# Patient Record
Sex: Female | Born: 1985 | Race: White | Hispanic: No | Marital: Married | Smoking: Current every day smoker
Health system: Southern US, Community
[De-identification: ages and names within clinical notes are randomized; demographics above are authoritative.]

## PROBLEM LIST (undated history)

## (undated) HISTORY — PX: CHOLECYSTECTOMY: SHX55

## (undated) HISTORY — PX: TUBAL LIGATION: SHX77

---

## 2006-03-27 ENCOUNTER — Emergency Department (HOSPITAL_COMMUNITY): Admission: EM | Admit: 2006-03-27 | Discharge: 2006-03-27 | Payer: Self-pay | Admitting: Emergency Medicine

## 2006-06-27 ENCOUNTER — Emergency Department (HOSPITAL_COMMUNITY): Admission: EM | Admit: 2006-06-27 | Discharge: 2006-06-27 | Payer: Self-pay | Admitting: Emergency Medicine

## 2006-11-07 ENCOUNTER — Emergency Department (HOSPITAL_COMMUNITY): Admission: EM | Admit: 2006-11-07 | Discharge: 2006-11-07 | Payer: Self-pay | Admitting: Emergency Medicine

## 2007-03-20 ENCOUNTER — Inpatient Hospital Stay (HOSPITAL_COMMUNITY): Admission: EM | Admit: 2007-03-20 | Discharge: 2007-03-22 | Payer: Self-pay | Admitting: Emergency Medicine

## 2007-03-21 ENCOUNTER — Encounter (INDEPENDENT_AMBULATORY_CARE_PROVIDER_SITE_OTHER): Payer: Self-pay | Admitting: *Deleted

## 2007-09-24 ENCOUNTER — Emergency Department (HOSPITAL_COMMUNITY): Admission: EM | Admit: 2007-09-24 | Discharge: 2007-09-24 | Payer: Self-pay | Admitting: Emergency Medicine

## 2009-03-03 ENCOUNTER — Other Ambulatory Visit: Admission: RE | Admit: 2009-03-03 | Discharge: 2009-03-03 | Payer: Self-pay | Admitting: Obstetrics and Gynecology

## 2009-08-18 ENCOUNTER — Inpatient Hospital Stay (HOSPITAL_COMMUNITY): Admission: AD | Admit: 2009-08-18 | Discharge: 2009-08-18 | Payer: Self-pay | Admitting: Obstetrics & Gynecology

## 2009-09-02 ENCOUNTER — Inpatient Hospital Stay (HOSPITAL_COMMUNITY): Admission: AD | Admit: 2009-09-02 | Discharge: 2009-09-04 | Payer: Self-pay | Admitting: Obstetrics & Gynecology

## 2009-09-02 ENCOUNTER — Ambulatory Visit: Payer: Self-pay | Admitting: Advanced Practice Midwife

## 2009-10-15 ENCOUNTER — Emergency Department (HOSPITAL_COMMUNITY): Admission: EM | Admit: 2009-10-15 | Discharge: 2009-10-15 | Payer: Self-pay | Admitting: Emergency Medicine

## 2010-02-08 ENCOUNTER — Emergency Department (HOSPITAL_COMMUNITY): Admission: EM | Admit: 2010-02-08 | Discharge: 2010-02-08 | Payer: Self-pay | Admitting: Emergency Medicine

## 2010-04-06 ENCOUNTER — Emergency Department (HOSPITAL_COMMUNITY): Admission: EM | Admit: 2010-04-06 | Discharge: 2010-04-07 | Payer: Self-pay | Admitting: Emergency Medicine

## 2010-05-05 ENCOUNTER — Encounter (HOSPITAL_COMMUNITY): Admission: RE | Admit: 2010-05-05 | Discharge: 2010-06-09 | Payer: Self-pay | Admitting: Orthopaedic Surgery

## 2010-06-10 ENCOUNTER — Encounter (HOSPITAL_COMMUNITY): Admission: RE | Admit: 2010-06-10 | Discharge: 2010-07-10 | Payer: Self-pay | Admitting: Orthopaedic Surgery

## 2011-03-17 LAB — URINALYSIS, ROUTINE W REFLEX MICROSCOPIC
Glucose, UA: NEGATIVE mg/dL
Ketones, ur: NEGATIVE mg/dL
Nitrite: NEGATIVE
pH: 5.5 (ref 5.0–8.0)

## 2011-03-17 LAB — DIFFERENTIAL
Basophils Absolute: 0 10*3/uL (ref 0.0–0.1)
Basophils Relative: 0 % (ref 0–1)
Eosinophils Absolute: 0.5 10*3/uL (ref 0.0–0.7)
Lymphocytes Relative: 29 % (ref 12–46)
Lymphs Abs: 2.4 10*3/uL (ref 0.7–4.0)
Monocytes Absolute: 0.4 10*3/uL (ref 0.1–1.0)
Monocytes Relative: 5 % (ref 3–12)

## 2011-03-17 LAB — CBC
Hemoglobin: 12.2 g/dL (ref 12.0–15.0)
MCHC: 35 g/dL (ref 30.0–36.0)
Platelets: 270 10*3/uL (ref 150–400)
RDW: 14.9 % (ref 11.5–15.5)

## 2011-03-17 LAB — URINE MICROSCOPIC-ADD ON

## 2011-03-17 LAB — PREGNANCY, URINE: Preg Test, Ur: NEGATIVE

## 2011-03-19 LAB — CBC
HCT: 31 % — ABNORMAL LOW (ref 36.0–46.0)
MCV: 86 fL (ref 78.0–100.0)
Platelets: 334 10*3/uL (ref 150–400)
RBC: 3.61 MIL/uL — ABNORMAL LOW (ref 3.87–5.11)
RDW: 14 % (ref 11.5–15.5)
WBC: 22.4 10*3/uL — ABNORMAL HIGH (ref 4.0–10.5)

## 2011-04-30 NOTE — Discharge Summary (Signed)
[name], [name]              ACCOUNT NO.:  [id number]   MEDICAL RECORD NO.:  [id number]          PATIENT TYPE:  INP   LOCATION:  A322                          FACILITY:  [facility]   PHYSICIAN:  Barbaraann Barthel, M.D. DATE OF BIRTH:  Oct 28, 1986   DATE OF ADMISSION:  03/20/2007  DATE OF DISCHARGE:  04/09/2008LH                               DISCHARGE SUMMARY   PROCEDURE:  On March 21, 2007 - laparoscopic cholecystectomy.   DIAGNOSIS:  Cholecystitis secondary to cholelithiasis.   HISTORY OF PRESENT ILLNESS:  This is a 25 year old white female who was  admitted through the emergency room for acute cholecystitis.  She had  been seen previously at [facility], was discharged and later  returned to our emergency room.   HOSPITAL COURSE:  She was admitted and antibiotics were begun.  Hydration was initiated and she was taken to surgery on March 21, 2007,  at which time a laparoscopic cholecystectomy was performed.  Final  pathology revealed cholecystitis and cholelithiasis.  Her hospital  course was completely benign.  She was discharged on the first  postoperative day.   LABORATORY DATA:  Her sonogram showed cholecystitis with cholelithiasis  present.   Her metabolic 7 was grossly within normal limits.  Her liver function  studies were also within normal limits.  Her bilirubin was 0.22.  Her  amylase was 63.  Her lipase was 24.   DISCHARGE EXAM:  At that time of discharge her wound was clean.  She was  voiding well without dysuria.  Her white count was 7.4 with an H and H  of 10.6 and 29.4.  Her liver function studies remained within normal  limits.   DISPOSITION:  We made plans to follow her perioperatively after surgery.  She is discharged on a liquid diet and a soft diet.  She is excused from  work.  She is told to do no heavy lifting.  She is permitted to go up  and down the stairs.  She is told to refrain from any vigorous sexual  activity.  She is told to clean her wound  with alcohol three times a  day.  She is given a prescription for Darvocet 1 tablet every 4 hours as  needed for pain.  She is to call us or come to the emergency room if  there are any acute changes.   Please note that this is the second dictation on this patient.      Barbaraann Barthel, M.D.  Electronically Signed    WB/MEDQ  D:  04/12/2007  T:  04/12/2007  Job:  [id number]

## 2011-04-30 NOTE — Consult Note (Signed)
[name], [name]              ACCOUNT NO.:  [id number]   MEDICAL RECORD NO.:  [id number]          PATIENT TYPE:  INP   LOCATION:  A322                          FACILITY:  [facility]   PHYSICIAN:  Barbaraann Barthel, M.D. DATE OF BIRTH:  01-18-1986   DATE OF CONSULTATION:  03/20/2007  DATE OF DISCHARGE:                                 CONSULTATION   Surgery was asked to see this 25 year old white female for cholecystitis  secondary to cholelithiasis.   CHIEF COMPLAINT:  Right upper quadrant pain with nausea and vomiting.  This has been going on for 2 weeks.   HISTORY OF PRESENT MEDICAL ILLNESS:  As the patient states that she had  this pain for at least 2 weeks.  She was seen in the emergency room at  [facility] where she was discharged, told that the stones would  pass spontaneously.  She continued to have discomfort and came to the  emergency room. Surgery was consulted and responded immediately.   PHYSICAL EXAMINATION:  VITAL SIGNS:  Examination discloses a pleasant 53-  year-old white female who is approximately 5 feet 1 inch and weighs 120  pounds.  Her vital signs are as follows.  She is afebrile with a  temperature of 98.3, blood pressure 134/89, pulse rate 80-81,  respirations 20.  O2 saturation was 98%.  HEENT:  Head is normocephalic.  Eyes:  Extraocular movements intact.  Pupils were round, react to light and accommodation.  There is no  conjunctival pallor or injected sclera.  Nose and oral mucosa are moist.  The patient has her tongue pierced.  NECK:  Supple and cylindrical without jugular vein distension,  thyromegaly or tracheal deviation.  No bruits are auscultated, and there  is no adenopathy.  HEART:  Regular rhythm.  LUNGS:  Clear.  BREASTS AND AXILLA:  Without masses.  ABDOMEN:  The patient has some tenderness in the right upper quadrant on  deep palpation.  Bowel sounds are normoactive.  There are no femoral  inguinal hernias appreciated.  The patient  has a pierced umbilicus.  RECTAL:  No tenderness on palpating the cervix through the rectum. The  stool is guaiac negative.  EXTREMITIES:  Within normal limits without clubbing, varicosities or  cyanosis.   REVIEW OF SYSTEMS:  GI SYSTEM: Nausea and vomiting, right upper quadrant  pain for least 2 weeks and prior to this time as well.  The patient has  known remembered history of hepatitis.  No bright red rectal bleeding,  black tarry stools, change in her bowel habits or unexplained weight  loss.   GU SYSTEM:  She has had some urinary tract infections in the past.  She  has no dysuria at present.  No history of nephrolithiasis.   ENDOCRINE SYSTEM:  No history of diabetes or thyroid disease.   MUSCULOSKELETAL SYSTEM:  Within normal limits.   OB/GYN HISTORY:  The patient is a gravida 3, para 3, abortus zero,  cesarean zero female who has no known history of carcinoma of the breast  in her family.  Her last menstrual period was a 2 days ago, and  her  urine HCG is negative here in the emergency room.   LABORATORY DATA:  Sonogram shows cholecystitis, cholelithiasis present  on sonography.   Her laboratory work shows that she has a white count of 8.2, hemoglobin  13, and hematocrit 37.4 with 64 neutrophils noted.  Metabolic-7 is  grossly within normal limits.  Liver function studies are within normal  limits.  Bilirubin 0.22. Amylase is 63.  Lipase is 24.   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  She has had no  other surgery.   She has no known allergies and takes no medications on a regular basis,  although her mother gave her some Vicodin and for the pain that she was  having related her recent gallbladder problems.   IMPRESSION:  Therefore, cholecystitis secondary to cholelithiasis.   PLAN:  1. Admit.  2. Hydration.  3. Diet restriction.  4. Initiate antibiotics.  5. We will plan for a  surgery during this hospitalization.      Barbaraann Barthel, M.D.  Electronically Signed     WB/MEDQ  D:  03/20/2007  T:  03/20/2007  Job:  [id number]   cc:   Rhae Lerner. Margretta Ditty, M.D.  [address]  [place]  [place] [address]

## 2011-04-30 NOTE — Op Note (Signed)
[name], [name]              ACCOUNT NO.:  [id number]   MEDICAL RECORD NO.:  [id number]          PATIENT TYPE:  INP   LOCATION:  A322                          FACILITY:  [facility]   PHYSICIAN:  Barbaraann Barthel, M.D. DATE OF BIRTH:  09/12/1986   DATE OF PROCEDURE:  03/21/2007  DATE OF DISCHARGE:                               OPERATIVE REPORT   SURGEON:  Barbaraann Barthel, M.D.   PREOPERATIVE DIAGNOSIS:  Cholecystitis and cholelithiasis.   POSTOPERATIVE DIAGNOSIS:  Cholecystitis and cholelithiasis.   SPECIMEN:  Gallbladder with stones.   PROCEDURE:  Laparoscopic cholecystectomy.   NOTE:  This is a 25 year old white female who came to the emergency room  with recurrent episodes of right upper quadrant pain, nausea and  vomiting.  Was found on sonography to have cholecystitis secondary to  cholelithiasis.  We discussed surgery with her preoperatively,  discussing complications not limited to but including bleeding,  infection, damage to bile ducts, perforation of organs and transitory  diarrhea.  Informed consent was obtained.   GROSS OPERATIVE FINDINGS:  Gallbladder with minimal amount of adhesions,  small cystic duct which was not cannulated and a sort of mulberry  appearing stone within the gallbladder.  The rest of the right upper  quadrant otherwise appeared to be normal.  The liver appeared to be have  somewhat fatty infiltration.   TECHNIQUE:  The patient was placed in the supine position.  After the  adequate administration of general anesthesia via endotracheal  intubation, her entire abdomen was prepped with Betadine solution and  draped in the usual manner.  Her Foley catheter was aseptically  inserted.  With the patient in Trendelenburg, a periumbilical incision  was carried out over the superior aspect of the umbilicus.  A sharp  towel clip was used to elevate the fascia.  A Veress needle was then  inserted and confirmed in position with a saline drop test.  The  abdomen  was then insufflated with approximately 3.5 liters of CO2.  We then  grasped the gallbladder after inserting an 11 mm cannula in the  epigastrium and two 5 mm cannulas in the right upper quadrant laterally  under direct vision from the camera in the umbilicus.  The gallbladder  was then grasped.  Its adhesions were taken down.  The cystic duct was  clearly visualized, triply silver clipped on the side of the common bile  duct and singly silver clipped on the side of the gallbladder and  divided.  The cystic artery was likewise clamped and divided.  The  gallbladder was then removed using the hook cautery device uneventfully  from the liver bed.  This was then removed using the EndoCatch device.  A stone came out of the gallbladder just at the time of removal of the  gallbladder and this was retrieved and placed in the Endo sac and  removed with the specimen.  After irrigating with normal saline solution  and checking for hemostasis, the abdomen was then desufflated and the  port incisions were then closed using 0 Polysorb in the area of the  umbilicus and the epigastrium.  We used 0.5% Sensorcaine approximately  20 cc to help with postoperative  comfort in all the port sites.  Skin was closed with the stapling  device.  Prior to closure all sponge, needle and instrument counts were  found to be correct.  Estimated blood loss was minimal.  The patient  received 1100 cc of crystalloid intraoperatively.  No drains were  placed.  There were no complications.      Barbaraann Barthel, M.D.  Electronically Signed     WB/MEDQ  D:  03/21/2007  T:  03/21/2007  Job:  [id number]   cc:   Rhae Lerner. Margretta Ditty, M.D.  [address]  [place]  [place] [address]

## 2011-08-29 IMAGING — CR DG CERVICAL SPINE COMPLETE 4+V
5 series · 5 of 5 positions shown · non-contrast
Comparison: None

CLINICAL DATA: Right neck pain with radiation the right upper
extremity

CERVICAL SPINE - COMPLETE 4+ VIEW

[view not recorded (1 of 5)]
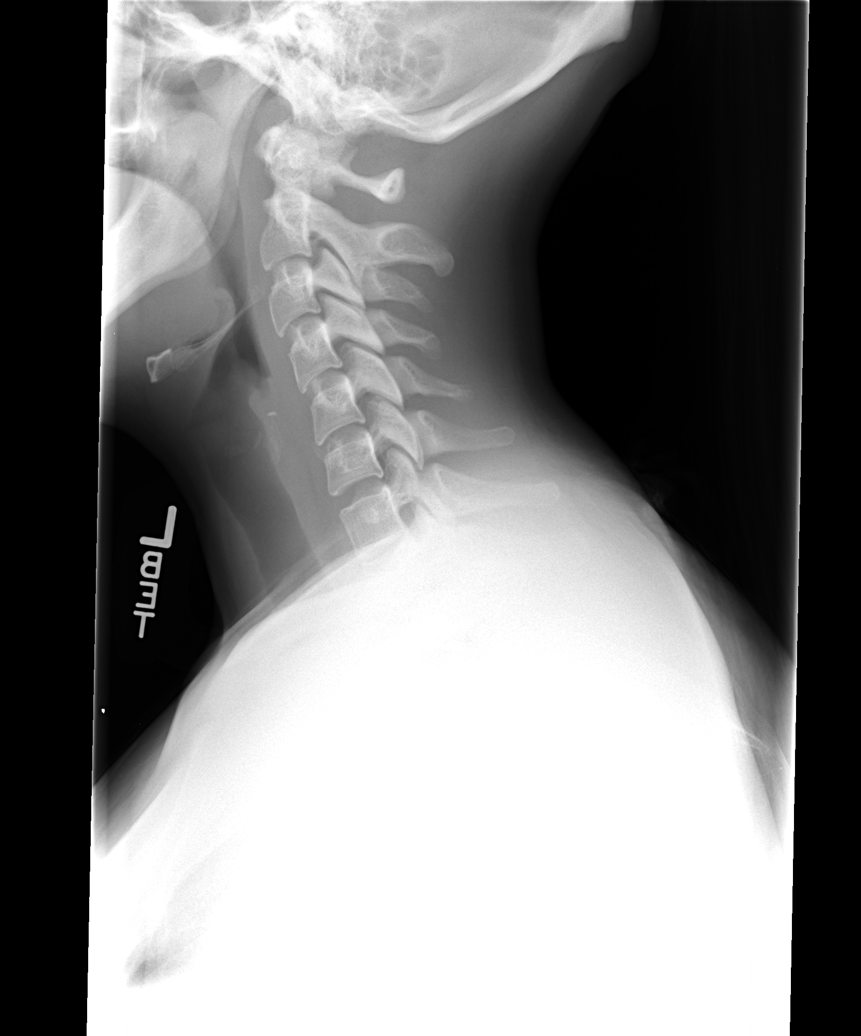

[view not recorded (2 of 5)]
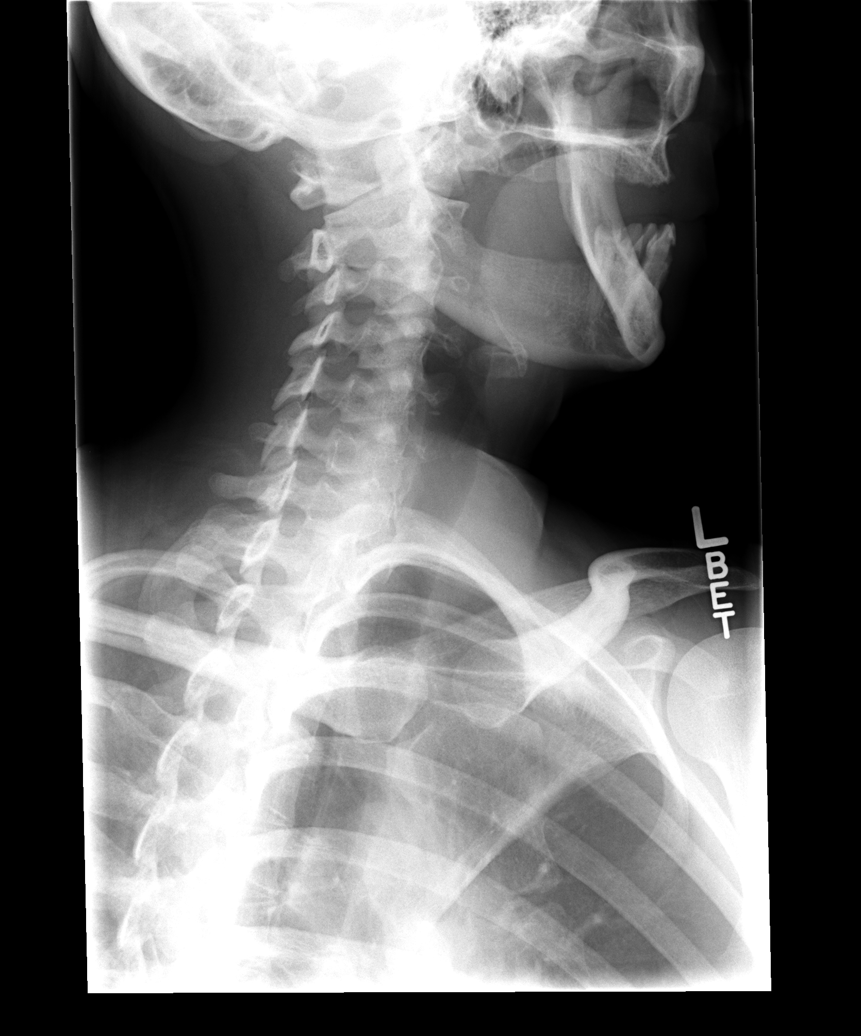

[view not recorded (3 of 5)]
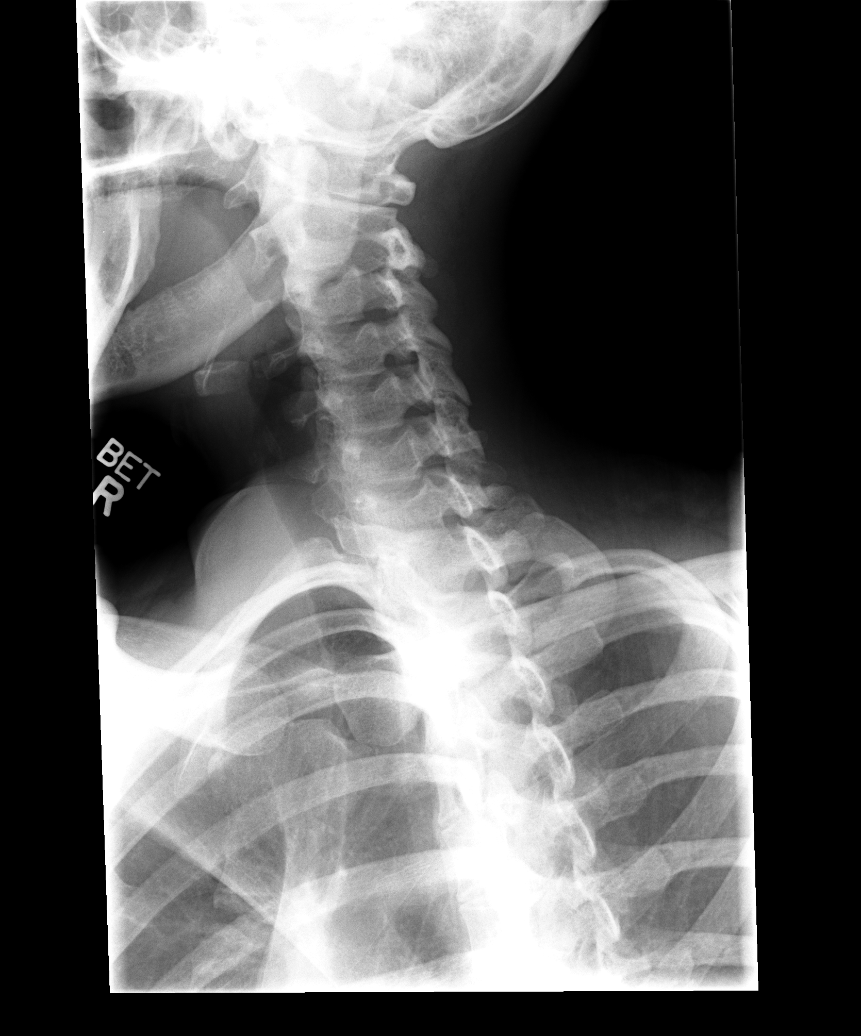

[view not recorded (4 of 5)]
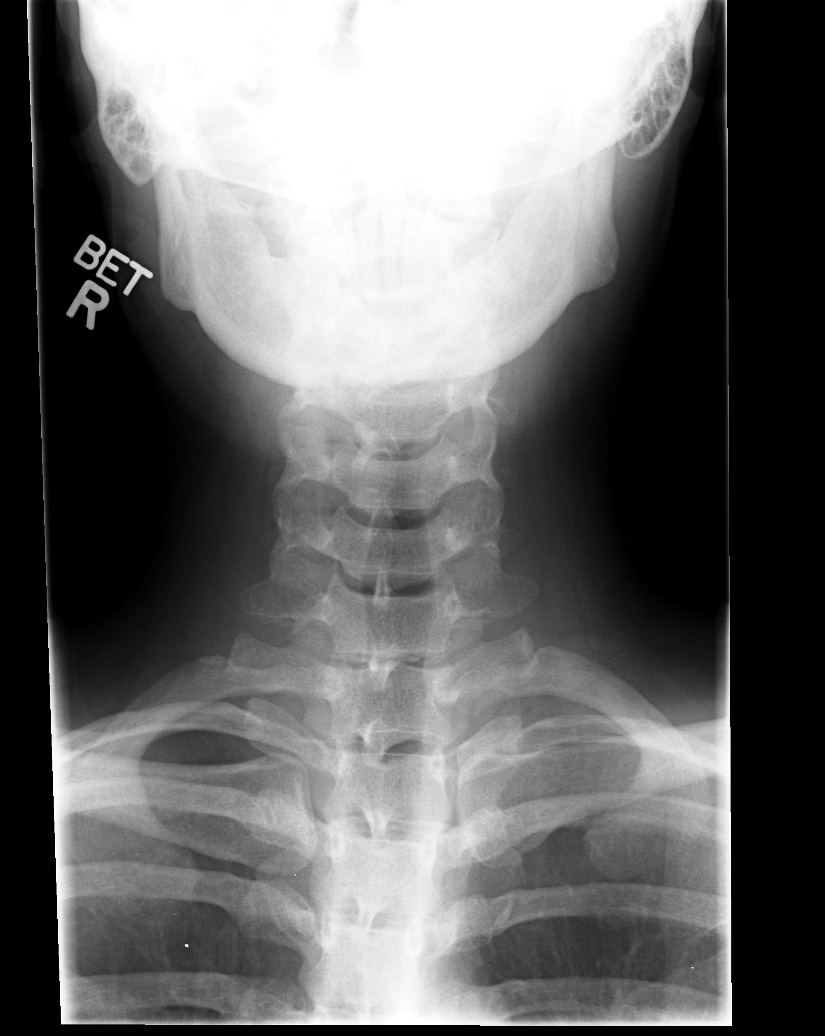

[view not recorded (5 of 5)]
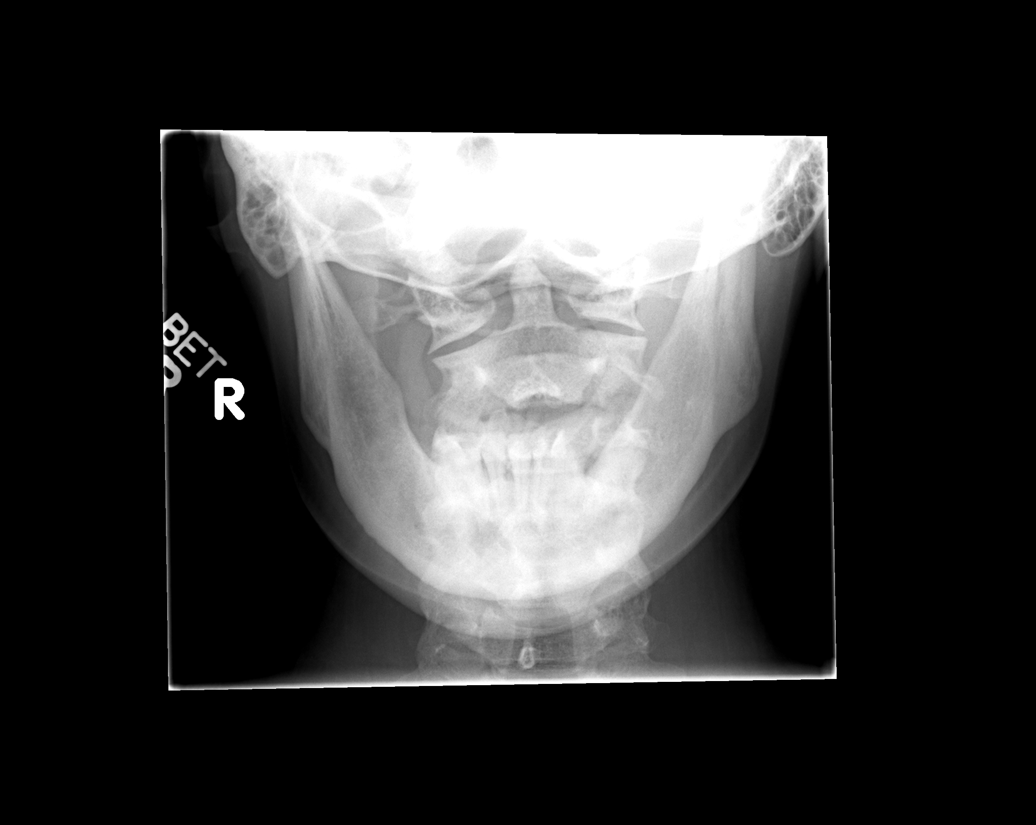

[5 of 5 positions shown; findings below may reference images not displayed]

FINDINGS: Cervical vertebrae normal in height and alignment.
Prevertebral soft tissues normal thickness.
Bone mineralization normal.
Bony foramina patent.
No acute fracture, subluxation, or bone destruction.
C1-C2 alignment normal.
IMPRESSION: No acute bony abnormalities.
If the patient has radicular symptoms, consider follow-up non
emergent MRI imaging of the cervical spine.

## 2016-02-04 ENCOUNTER — Telehealth: Payer: Self-pay | Admitting: Orthopaedic Surgery

## 2016-02-04 NOTE — Telephone Encounter (Signed)
Patient requesting refill for Hydrocodone 7.5/325mg   Qty 120 tablets

## 2016-02-05 ENCOUNTER — Ambulatory Visit (INDEPENDENT_AMBULATORY_CARE_PROVIDER_SITE_OTHER): Payer: Medicaid Other

## 2016-02-05 ENCOUNTER — Ambulatory Visit (INDEPENDENT_AMBULATORY_CARE_PROVIDER_SITE_OTHER): Payer: Medicaid Other | Admitting: Orthopaedic Surgery

## 2016-02-05 ENCOUNTER — Encounter: Payer: Self-pay | Admitting: Orthopaedic Surgery

## 2016-02-05 VITALS — BP 141/85 | HR 98 | Temp 98.1°F | Resp 16 | Ht 62.0 in | Wt 110.0 lb

## 2016-02-05 DIAGNOSIS — M542 Cervicalgia: Secondary | ICD-10-CM

## 2016-02-05 MED ORDER — HYDROCODONE-ACETAMINOPHEN 7.5-325 MG PO TABS: 1.0000 | ORAL_TABLET | ORAL | Status: DC | PRN

## 2016-02-05 NOTE — Telephone Encounter (Signed)
Left message that prescription is available for pick up.

## 2016-02-05 NOTE — Progress Notes (Addendum)
Patient [name], female DOB:11/03/1986, 30 y.o. [id number]:[id number]  Chief Complaint  Patient presents with  . Follow-up    follow up neck pain, "not to bad today"    HPI  [name] is a 30 y.o. female who has a long history of chronic neck pain with some right neck and upper trapezius pain.  She has no trauma.  She has no paresthesias.   Her pain is deep and throbbing at times.  It is not getting better and getting worse over the last few weeks.  She has tried ice and heat and Advil as well as her pain medicine.  The pain medicine works the best.  She does some exercises as well. HPI  Body mass index is 20.11 kg/(m^2).   Review of Systems  Constitutional:       Patient does not have Diabetes Mellitus. Patient does not have hypertension. Patient does not have COPD or shortness of breath. Patient does not have BMI > 35. Patient has current smoking history.  HENT: Negative for congestion.   Respiratory: Negative for cough and shortness of breath.   Cardiovascular: Positive for chest pain.  Endocrine: Positive for cold intolerance.  Musculoskeletal: Positive for myalgias, arthralgias and neck pain.  Allergic/Immunologic: Positive for environmental allergies.  Psychiatric/Behavioral: The patient is nervous/anxious.     No past medical history on file.  No past surgical history on file.  No family history on file.  Social History Social History  Substance Use Topics  . Smoking status: Current Every Day Smoker  . Smokeless tobacco: None  . Alcohol Use: None    No Known Allergies  Current Outpatient Prescriptions  Medication Sig Dispense Refill  . HYDROcodone-acetaminophen (NORCO) 7.5-325 MG tablet Take 1 tablet by mouth every 4 (four) hours as needed for moderate pain (Must last 30 days.  Do not drive or operate machinery while taking this medicine.). 120 tablet 0   No current facility-administered medications for this visit.     Physical Exam  Blood  pressure 141/85, pulse 98, temperature 98.1 F (36.7 C), resp. rate 16, height  (1.575 m), weight 110 lb (49.896 kg), last menstrual period 01/19/2016.  Constitutional: overall normal hygiene, normal nutrition, well developed, normal grooming, normal body habitus. Assistive device:none  Musculoskeletal: gait and station Limp none, muscle tone and strength are normal, no tremors or atrophy is present.  .  Neurological: coordination overall normal.  Deep tendon reflex/nerve stretch intact.  Sensation normal.  Cranial nerves II-XII intact.   Skin:   normal overall no scars, lesions, ulcers or rashes. No psoriasis.  Psychiatric: Alert and oriented x 3.  Recent memory intact, remote memory unclear.  Normal mood and affect. Well groomed.  Good eye contact.  Cardiovascular: overall no swelling, no varicosities, no edema bilaterally, normal temperatures of the legs and arms, no clubbing, cyanosis and good capillary refill.  Lymphatic: palpation is normal.  She has pain of the right side of the neck and upper trapezius with no spasm.    Extremities:upper extremities are normal Inspection normal Strength and tone normal upper extremities Range of motion full of neck and of both shoulders but the neck has more pain to the right.  Additional services performed: x-rays of the neck. I have talked to her about her smoking.  She is not willing to quit. She needs to consider this.  I went over exercises with her to do.  The patient has been educated about the nature of the problem(s) and counseled  on treatment options.  The patient appeared to understand what I have discussed and is in agreement with it. Encounter Diagnosis  Name Primary?  . Neck pain Yes    PLAN Call if any problems.  Precautions discussed.  Continue current medications.   Return to clinic 2 months

## 2016-02-05 NOTE — Telephone Encounter (Signed)
Rx printed

## 2016-02-05 NOTE — Patient Instructions (Signed)
Smoking Cessation, Tips for Success If you are ready to quit smoking, congratulations! You have chosen to help yourself be healthier. Cigarettes bring nicotine, tar, carbon monoxide, and other irritants into your body. Your lungs, heart, and blood vessels will be able to work better without these poisons. There are many different ways to quit smoking. Nicotine gum, nicotine patches, a nicotine inhaler, or nicotine nasal spray can help with physical craving. Hypnosis, support groups, and medicines help break the habit of smoking. WHAT THINGS CAN I DO TO MAKE QUITTING EASIER?  Here are some tips to help you quit for good:  Pick a date when you will quit smoking completely. Tell all of your friends and family about your plan to quit on that date.  Do not try to slowly cut down on the number of cigarettes you are smoking. Pick a quit date and quit smoking completely starting on that day.  Throw away all cigarettes.   Clean and remove all ashtrays from your home, work, and car.  On a card, write down your reasons for quitting. Carry the card with you and read it when you get the urge to smoke.  Cleanse your body of nicotine. Drink enough water and fluids to keep your urine clear or pale yellow. Do this after quitting to flush the nicotine from your body.  Learn to predict your moods. Do not let a bad situation be your excuse to have a cigarette. Some situations in your life might tempt you into wanting a cigarette.  Never have "just one" cigarette. It leads to wanting another and another. Remind yourself of your decision to quit.  Change habits associated with smoking. If you smoked while driving or when feeling stressed, try other activities to replace smoking. Stand up when drinking your coffee. Brush your teeth after eating. Sit in a different chair when you read the paper. Avoid alcohol while trying to quit, and try to drink fewer caffeinated beverages. Alcohol and caffeine may urge you to  smoke.  Avoid foods and drinks that can trigger a desire to smoke, such as sugary or spicy foods and alcohol.  Ask people who smoke not to smoke around you.  Have something planned to do right after eating or having a cup of coffee. For example, plan to take a walk or exercise.  Try a relaxation exercise to calm you down and decrease your stress. Remember, you may be tense and nervous for the first 2 weeks after you quit, but this will pass.  Find new activities to keep your hands busy. Play with a pen, coin, or rubber band. Doodle or draw things on paper.  Brush your teeth right after eating. This will help cut down on the craving for the taste of tobacco after meals. You can also try mouthwash.   Use oral substitutes in place of cigarettes. Try using lemon drops, carrots, cinnamon sticks, or chewing gum. Keep them handy so they are available when you have the urge to smoke.  When you have the urge to smoke, try deep breathing.  Designate your home as a nonsmoking area.  If you are a heavy smoker, ask your health care provider about a prescription for nicotine chewing gum. It can ease your withdrawal from nicotine.  Reward yourself. Set aside the cigarette money you save and buy yourself something nice.  Look for support from others. Join a support group or smoking cessation program. Ask someone at home or at work to help you with your plan   to quit smoking.  Always ask yourself, "Do I need this cigarette or is this just a reflex?" Tell yourself, "Today, I choose not to smoke," or "I do not want to smoke." You are reminding yourself of your decision to quit.  Do not replace cigarette smoking with electronic cigarettes (commonly called e-cigarettes). The safety of e-cigarettes is unknown, and some may contain harmful chemicals.  If you relapse, do not give up! Plan ahead and think about what you will do the next time you get the urge to smoke. HOW WILL I FEEL WHEN I QUIT SMOKING? You  may have symptoms of withdrawal because your body is used to nicotine (the addictive substance in cigarettes). You may crave cigarettes, be irritable, feel very hungry, cough often, get headaches, or have difficulty concentrating. The withdrawal symptoms are only temporary. They are strongest when you first quit but will go away within 10-14 days. When withdrawal symptoms occur, stay in control. Think about your reasons for quitting. Remind yourself that these are signs that your body is healing and getting used to being without cigarettes. Remember that withdrawal symptoms are easier to treat than the major diseases that smoking can cause.  Even after the withdrawal is over, expect periodic urges to smoke. However, these cravings are generally short lived and will go away whether you smoke or not. Do not smoke! WHAT RESOURCES ARE AVAILABLE TO HELP ME QUIT SMOKING? Your health care provider can direct you to community resources or hospitals for support, which may include:  Group support.  Education.  Hypnosis.  Therapy.   This information is not intended to replace advice given to you by your health care provider. Make sure you discuss any questions you have with your health care provider.   Document Released: 08/27/2004 Document Revised: 12/20/2014 Document Reviewed: 05/17/2013 Elsevier Interactive Patient Education 2016 [employer] Inc[employer]  

## 2016-03-02 ENCOUNTER — Telehealth: Payer: Self-pay | Admitting: Orthopaedic Surgery

## 2016-03-02 MED ORDER — HYDROCODONE-ACETAMINOPHEN 5-325 MG PO TABS: 1.0000 | ORAL_TABLET | ORAL | Status: DC | PRN

## 2016-03-02 NOTE — Telephone Encounter (Signed)
Patient requesting refill of Hydrocodone  

## 2016-03-02 NOTE — Telephone Encounter (Signed)
Rx printed

## 2016-04-06 ENCOUNTER — Ambulatory Visit (INDEPENDENT_AMBULATORY_CARE_PROVIDER_SITE_OTHER): Payer: Medicaid Other | Admitting: Orthopaedic Surgery

## 2016-04-06 ENCOUNTER — Encounter: Payer: Self-pay | Admitting: Orthopaedic Surgery

## 2016-04-06 VITALS — BP 139/92 | HR 96 | Ht 62.0 in | Wt 110.0 lb

## 2016-04-06 DIAGNOSIS — M542 Cervicalgia: Secondary | ICD-10-CM | POA: Diagnosis not present

## 2016-04-06 MED ORDER — HYDROCODONE-ACETAMINOPHEN 5-325 MG PO TABS: 1.0000 | ORAL_TABLET | ORAL | Status: DC | PRN

## 2016-04-06 NOTE — Progress Notes (Signed)
Patient WG:NFAOZHY:[name], female DOB:May 08, 1986, 30 y.o. [id number]  Chief Complaint  Patient presents with  . Follow-up    neck pain     HPI  [name] is a 30 y.o. female who has chronic neck pain.  She is stable.  She has no paresthesias.  She has no new trauma.  She has been doing her exercises. She is taking her exercises and using ice at times.   HPI  Body mass index is 20.11 kg/(m^2).  Review of Systems  Constitutional:       Patient does not have Diabetes Mellitus. Patient does not have hypertension. Patient does not have COPD or shortness of breath. Patient does not have BMI > 35. Patient has current smoking history.  HENT: Negative for congestion.   Respiratory: Negative for cough and shortness of breath.   Cardiovascular: Positive for chest pain.  Endocrine: Positive for cold intolerance.  Musculoskeletal: Positive for myalgias, arthralgias and neck pain.  Allergic/Immunologic: Positive for environmental allergies.  Psychiatric/Behavioral: The patient is nervous/anxious.     History reviewed. No pertinent past medical history.  History reviewed. No pertinent past surgical history.  History reviewed. No pertinent family history.  Social History Social History  Substance Use Topics  . Smoking status: Current Every Day Smoker  . Smokeless tobacco: None  . Alcohol Use: None    No Known Allergies  Current Outpatient Prescriptions  Medication Sig Dispense Refill  . acetaminophen (TYLENOL) 325 MG tablet Take 650 mg by mouth every 6 (six) hours as needed.    Marland Kitchen. HYDROcodone-acetaminophen (NORCO/VICODIN) 5-325 MG tablet Take 1 tablet by mouth every 4 (four) hours as needed for moderate pain (Must last 30 days.  Do not take and drive a car or use machinery.). 120 tablet 0   No current facility-administered medications for this visit.     Physical Exam  Blood pressure 139/92, pulse 96, height 5\' 2"  (1.575 m), weight 110 lb (49.896  kg).  Constitutional: overall normal hygiene, normal nutrition, well developed, normal grooming, normal body habitus. Assistive device:none  Musculoskeletal: gait and station Limp none, muscle tone and strength are normal, no tremors or atrophy is present.  .  Neurological: coordination overall normal.  Deep tendon reflex/nerve stretch intact.  Sensation normal.  Cranial nerves II-XII intact.   Skin:   normal overall no scars, lesions, ulcers or rashes. No psoriasis.  Psychiatric: Alert and oriented x 3.  Recent memory intact, remote memory unclear.  Normal mood and affect. Well groomed.  Good eye contact.  Cardiovascular: overall no swelling, no varicosities, no edema bilaterally, normal temperatures of the legs and arms, no clubbing, cyanosis and good capillary refill.  Lymphatic: palpation is normal. Back Exam   Tenderness  The patient is experiencing tenderness in the cervical.  Range of Motion  The patient has normal back ROM.  Muscle Strength  The patient has normal back strength.  Tests  Straight leg raise right: negative Straight leg raise left: negative  Reflexes  Patellar: normal Achilles: normal Biceps: normal Babinski's sign: normal   Other  Toe Walk: normal Heel Walk: normal Sensation: normal Gait: normal  Erythema: no back redness Scars: absent      The patient has been educated about the nature of the problem(s) and counseled on treatment options.  The patient appeared to understand what I have discussed and is in agreement with it.  Encounter Diagnosis  Name Primary?  . Neck pain Yes    PLAN Call if any problems.  Precautions discussed.  Continue current medications.   Return to clinic 2 months

## 2016-05-05 ENCOUNTER — Telehealth: Payer: Self-pay | Admitting: Orthopaedic Surgery

## 2016-05-06 MED ORDER — HYDROCODONE-ACETAMINOPHEN 5-325 MG PO TABS: 1.0000 | ORAL_TABLET | ORAL | Status: DC | PRN

## 2016-05-06 NOTE — Telephone Encounter (Signed)
Rx done. 

## 2016-06-04 ENCOUNTER — Telehealth: Payer: Self-pay | Admitting: Orthopaedic Surgery

## 2016-06-07 MED ORDER — HYDROCODONE-ACETAMINOPHEN 5-325 MG PO TABS: 1.0000 | ORAL_TABLET | ORAL | Status: DC | PRN

## 2016-06-07 NOTE — Addendum Note (Signed)
Addended by: Shyloh Derosa, JOHN W on: 06/07/2016 02:20 PM   Modules accepted: Orders  

## 2016-06-07 NOTE — Telephone Encounter (Signed)
Rx done. 

## 2016-06-08 ENCOUNTER — Encounter: Payer: Self-pay | Admitting: Orthopaedic Surgery

## 2016-06-08 ENCOUNTER — Ambulatory Visit (INDEPENDENT_AMBULATORY_CARE_PROVIDER_SITE_OTHER): Payer: Medicaid Other | Admitting: Orthopaedic Surgery

## 2016-06-08 VITALS — BP 144/88 | HR 81 | Temp 97.5°F | Ht 61.0 in | Wt 113.0 lb

## 2016-06-08 DIAGNOSIS — M542 Cervicalgia: Secondary | ICD-10-CM

## 2016-06-08 NOTE — Progress Notes (Signed)
Patient UJ:[id number]:Dawn Sherron AlesD Busch, female DOB:05-12-1986, 30 y.o. WGN:[id number]  Chief Complaint  Patient presents with  . Follow-up    neck pain     HPI  [name] is a 30 y.o. female who has chronic neck pain.  She has no paresthesias.  She has no new trauma.  She has tightness in the neck at times.  She has been doing her exercises and taking her medicine.  HPI  Body mass index is 21.36 kg/(m^2).  ROS  Review of Systems  Constitutional:       Patient does not have Diabetes Mellitus. Patient does not have hypertension. Patient does not have COPD or shortness of breath. Patient does not have BMI > 35. Patient has current smoking history.  HENT: Negative for congestion.   Respiratory: Negative for cough and shortness of breath.   Cardiovascular: Positive for chest pain.  Endocrine: Positive for cold intolerance.  Musculoskeletal: Positive for myalgias, arthralgias and neck pain.  Allergic/Immunologic: Positive for environmental allergies.  Psychiatric/Behavioral: The patient is nervous/anxious.     History reviewed. No pertinent past medical history.  Past Surgical History  Procedure Laterality Date  . Tubal ligation    . Cholecystectomy      History reviewed. No pertinent family history.  Social History Social History  Substance Use Topics  . Smoking status: Current Every Day Smoker -- 0.75 packs/day for 13 years    Types: Cigarettes  . Smokeless tobacco: Never Used  . Alcohol Use: No    No Known Allergies  Current Outpatient Prescriptions  Medication Sig Dispense Refill  . acetaminophen (TYLENOL) 325 MG tablet Take 650 mg by mouth every 6 (six) hours as needed.    Marland Kitchen. HYDROcodone-acetaminophen (NORCO/VICODIN) 5-325 MG tablet Take 1 tablet by mouth every 4 (four) hours as needed for moderate pain (Must last 30 days.  Do not take and drive a car or use machinery.). 120 tablet 0   No current facility-administered medications for this visit.     Physical  Exam  Blood pressure 144/88, pulse 81, temperature 97.5 F (36.4 C), height 5\' 1"  (1.549 m), weight 113 lb (51.256 kg).  Constitutional: overall normal hygiene, normal nutrition, well developed, normal grooming, normal body habitus. Assistive device:none  Musculoskeletal: gait and station Limp none, muscle tone and strength are normal, no tremors or atrophy is present.  .  Neurological: coordination overall normal.  Deep tendon reflex/nerve stretch intact.  Sensation normal.  Cranial nerves II-XII intact.   Skin:   normal overall no scars, lesions, ulcers or rashes. No psoriasis.  Psychiatric: Alert and oriented x 3.  Recent memory intact, remote memory unclear.  Normal mood and affect. Well groomed.  Good eye contact.  Cardiovascular: overall no swelling, no varicosities, no edema bilaterally, normal temperatures of the legs and arms, no clubbing, cyanosis and good capillary refill.  Lymphatic: palpation is normal.  Neck has full motion but tender more to the right.  She has some tightness of the right nuchal muscles and right upper trapezius.  ROM of both shoulders full and NV intact.  The patient has been educated about the nature of the problem(s) and counseled on treatment options.  The patient appeared to understand what I have discussed and is in agreement with it.  Encounter Diagnosis  Name Primary?  . Neck pain Yes    PLAN Call if any problems.  Precautions discussed.  Continue current medications.   Return to clinic 3 months   Electronically Signed Darreld McleanWayne Roselynne Lortz, MD 6/27/201710:25  AM

## 2016-07-04 ENCOUNTER — Other Ambulatory Visit: Payer: Self-pay | Admitting: Orthopaedic Surgery

## 2016-07-05 ENCOUNTER — Telehealth: Payer: Self-pay | Admitting: Orthopaedic Surgery

## 2016-07-06 MED ORDER — HYDROCODONE-ACETAMINOPHEN 5-325 MG PO TABS: 1.0000 | ORAL_TABLET | ORAL | 0 refills | Status: DC | PRN

## 2016-08-04 ENCOUNTER — Telehealth: Payer: Self-pay | Admitting: Orthopaedic Surgery

## 2016-08-04 MED ORDER — HYDROCODONE-ACETAMINOPHEN 5-325 MG PO TABS: 1.0000 | ORAL_TABLET | ORAL | 0 refills | Status: DC | PRN

## 2016-09-01 ENCOUNTER — Telehealth: Payer: Self-pay | Admitting: Orthopaedic Surgery

## 2016-09-02 MED ORDER — HYDROCODONE-ACETAMINOPHEN 5-325 MG PO TABS: 1.0000 | ORAL_TABLET | Freq: Four times a day (QID) | ORAL | 0 refills | Status: DC | PRN

## 2016-09-08 ENCOUNTER — Ambulatory Visit (INDEPENDENT_AMBULATORY_CARE_PROVIDER_SITE_OTHER): Payer: Medicaid Other | Admitting: Orthopaedic Surgery

## 2016-09-08 ENCOUNTER — Encounter: Payer: Self-pay | Admitting: Orthopaedic Surgery

## 2016-09-08 VITALS — BP 102/78 | HR 83 | Temp 97.9°F | Ht 62.0 in | Wt 108.6 lb

## 2016-09-08 DIAGNOSIS — M542 Cervicalgia: Secondary | ICD-10-CM

## 2016-09-08 DIAGNOSIS — F17210 Nicotine dependence, cigarettes, uncomplicated: Secondary | ICD-10-CM | POA: Diagnosis not present

## 2016-09-08 MED ORDER — HYDROCODONE-ACETAMINOPHEN 5-325 MG PO TABS: 1.0000 | ORAL_TABLET | Freq: Four times a day (QID) | ORAL | 0 refills | Status: DC | PRN

## 2016-09-08 NOTE — Patient Instructions (Signed)
Smoking Cessation, Tips for Success If you are ready to quit smoking, congratulations! You have chosen to help yourself be healthier. Cigarettes bring nicotine, tar, carbon monoxide, and other irritants into your body. Your lungs, heart, and blood vessels will be able to work better without these poisons. There are many different ways to quit smoking. Nicotine gum, nicotine patches, a nicotine inhaler, or nicotine nasal spray can help with physical craving. Hypnosis, support groups, and medicines help break the habit of smoking. WHAT THINGS CAN I DO TO MAKE QUITTING EASIER?  Here are some tips to help you quit for good:  Pick a date when you will quit smoking completely. Tell all of your friends and family about your plan to quit on that date.  Do not try to slowly cut down on the number of cigarettes you are smoking. Pick a quit date and quit smoking completely starting on that day.  Throw away all cigarettes.   Clean and remove all ashtrays from your home, work, and car.  On a card, write down your reasons for quitting. Carry the card with you and read it when you get the urge to smoke.  Cleanse your body of nicotine. Drink enough water and fluids to keep your urine clear or pale yellow. Do this after quitting to flush the nicotine from your body.  Learn to predict your moods. Do not let a bad situation be your excuse to have a cigarette. Some situations in your life might tempt you into wanting a cigarette.  Never have "just one" cigarette. It leads to wanting another and another. Remind yourself of your decision to quit.  Change habits associated with smoking. If you smoked while driving or when feeling stressed, try other activities to replace smoking. Stand up when drinking your coffee. Brush your teeth after eating. Sit in a different chair when you read the paper. Avoid alcohol while trying to quit, and try to drink fewer caffeinated beverages. Alcohol and caffeine may urge you to  smoke.  Avoid foods and drinks that can trigger a desire to smoke, such as sugary or spicy foods and alcohol.  Ask people who smoke not to smoke around you.  Have something planned to do right after eating or having a cup of coffee. For example, plan to take a walk or exercise.  Try a relaxation exercise to calm you down and decrease your stress. Remember, you may be tense and nervous for the first 2 weeks after you quit, but this will pass.  Find new activities to keep your hands busy. Play with a pen, coin, or rubber band. Doodle or draw things on paper.  Brush your teeth right after eating. This will help cut down on the craving for the taste of tobacco after meals. You can also try mouthwash.   Use oral substitutes in place of cigarettes. Try using lemon drops, carrots, cinnamon sticks, or chewing gum. Keep them handy so they are available when you have the urge to smoke.  When you have the urge to smoke, try deep breathing.  Designate your home as a nonsmoking area.  If you are a heavy smoker, ask your health care provider about a prescription for nicotine chewing gum. It can ease your withdrawal from nicotine.  Reward yourself. Set aside the cigarette money you save and buy yourself something nice.  Look for support from others. Join a support group or smoking cessation program. Ask someone at home or at work to help you with your plan   to quit smoking.  Always ask yourself, "Do I need this cigarette or is this just a reflex?" Tell yourself, "Today, I choose not to smoke," or "I do not want to smoke." You are reminding yourself of your decision to quit.  Do not replace cigarette smoking with electronic cigarettes (commonly called e-cigarettes). The safety of e-cigarettes is unknown, and some may contain harmful chemicals.  If you relapse, do not give up! Plan ahead and think about what you will do the next time you get the urge to smoke. HOW WILL I FEEL WHEN I QUIT SMOKING? You  may have symptoms of withdrawal because your body is used to nicotine (the addictive substance in cigarettes). You may crave cigarettes, be irritable, feel very hungry, cough often, get headaches, or have difficulty concentrating. The withdrawal symptoms are only temporary. They are strongest when you first quit but will go away within 10-14 days. When withdrawal symptoms occur, stay in control. Think about your reasons for quitting. Remind yourself that these are signs that your body is healing and getting used to being without cigarettes. Remember that withdrawal symptoms are easier to treat than the major diseases that smoking can cause.  Even after the withdrawal is over, expect periodic urges to smoke. However, these cravings are generally short lived and will go away whether you smoke or not. Do not smoke! WHAT RESOURCES ARE AVAILABLE TO HELP ME QUIT SMOKING? Your health care provider can direct you to community resources or hospitals for support, which may include:  Group support.  Education.  Hypnosis.  Therapy.   This information is not intended to replace advice given to you by your health care provider. Make sure you discuss any questions you have with your health care provider.   Document Released: 08/27/2004 Document Revised: 12/20/2014 Document Reviewed: 05/17/2013 Elsevier Interactive Patient Education 2016 [employer] Inc[employer]  

## 2016-09-08 NOTE — Progress Notes (Signed)
Patient XB:[id number]:[name], female DOB:[birth year], 30 y.o. AOZ:[id number]  Chief Complaint  Patient presents with  . Follow-up    neck pain    HPI  [name] is a 30 y.o. female who has chronic neck pain.  She has no new trauma, no paresthesias. She is doing her exercises. HPI  Body mass index is 19.86 kg/m.  ROS  Review of Systems  Constitutional:       Patient does not have Diabetes Mellitus. Patient does not have hypertension. Patient does not have COPD or shortness of breath. Patient does not have BMI > 35. Patient has current smoking history.  HENT: Negative for congestion.   Respiratory: Negative for cough and shortness of breath.   Cardiovascular: Positive for chest pain.  Endocrine: Positive for cold intolerance.  Musculoskeletal: Positive for arthralgias, myalgias and neck pain.  Allergic/Immunologic: Positive for environmental allergies.  Psychiatric/Behavioral: The patient is nervous/anxious.     No past medical history on file.  Past Surgical History:  Procedure Laterality Date  . CHOLECYSTECTOMY    . TUBAL LIGATION      No family history on file.  Social History Social History  Substance Use Topics  . Smoking status: Current Every Day Smoker    Packs/day: 0.75    Years: 13.00    Types: Cigarettes  . Smokeless tobacco: Never Used  . Alcohol use No    No Known Allergies  Current Outpatient Prescriptions  Medication Sig Dispense Refill  . acetaminophen (TYLENOL) 325 MG tablet Take 650 mg by mouth every 6 (six) hours as needed.    Marland Kitchen. HYDROcodone-acetaminophen (NORCO/VICODIN) 5-325 MG tablet Take 1 tablet by mouth every 6 (six) hours as needed for moderate pain (Must last 14 days.Do not take and drive a car or use machinery.). 56 tablet 0   No current facility-administered medications for this visit.      Physical Exam  Blood pressure 102/78, pulse 83, temperature 97.9 F (36.6 C), height 5\' 2"  (1.575 m), weight 108 lb 9.6 oz (49.3  kg).  Constitutional: overall normal hygiene, normal nutrition, well developed, normal grooming, normal body habitus. Assistive device:none  Musculoskeletal: gait and station Limp none, muscle tone and strength are normal, no tremors or atrophy is present.  .  Neurological: coordination overall normal.  Deep tendon reflex/nerve stretch intact.  Sensation normal.  Cranial nerves II-XII intact.   Skin:   Normal overall no scars, lesions, ulcers or rashes. No psoriasis.  Psychiatric: Alert and oriented x 3.  Recent memory intact, remote memory unclear.  Normal mood and affect. Well groomed.  Good eye contact.  Cardiovascular: overall no swelling, no varicosities, no edema bilaterally, normal temperatures of the legs and arms, no clubbing, cyanosis and good capillary refill.  Lymphatic: palpation is normal.  Her neck has full motion, NV intact, no spasm.  She continues to smoke.  She is willing to stop and is using patches now.    The patient has been educated about the nature of the problem(s) and counseled on treatment options.  The patient appeared to understand what I have discussed and is in agreement with it.  Encounter Diagnoses  Name Primary?  . Neck pain Yes  . Cigarette nicotine dependence without complication     PLAN Call if any problems.  Precautions discussed.  Continue current medications.   Return to clinic 3 months   Electronically Signed Darreld McleanWayne Veatrice Eckstein, MD 9/27/201710:45 AM

## 2016-09-21 ENCOUNTER — Telehealth: Payer: Self-pay | Admitting: Orthopaedic Surgery

## 2016-09-22 MED ORDER — HYDROCODONE-ACETAMINOPHEN 5-325 MG PO TABS: 1.0000 | ORAL_TABLET | Freq: Four times a day (QID) | ORAL | 0 refills | Status: DC | PRN

## 2016-10-12 ENCOUNTER — Telehealth: Payer: Self-pay | Admitting: Orthopaedic Surgery

## 2016-10-12 MED ORDER — HYDROCODONE-ACETAMINOPHEN 5-325 MG PO TABS: 1.0000 | ORAL_TABLET | Freq: Four times a day (QID) | ORAL | 0 refills | Status: DC | PRN

## 2016-10-12 NOTE — Telephone Encounter (Signed)
Patient had a problem with trying to get on Mychart.  She asks that you allow her to call in prescription this time until she gets the issues with Mychart resolved.  Hydrocodone/Acetaminophen 5-325 mgs Qty  45   Sig: Take 1 tablet by mouth every 6 (six) hours as needed for moderate pain (Must last 14 days.Do not take and drive a car or use machinery.).

## 2016-10-18 MED ORDER — HYDROCODONE-ACETAMINOPHEN 5-325 MG PO TABS: 1.0000 | ORAL_TABLET | Freq: Four times a day (QID) | ORAL | 0 refills | Status: DC | PRN

## 2016-11-01 ENCOUNTER — Telehealth: Payer: Self-pay | Admitting: Orthopaedic Surgery

## 2016-11-01 MED ORDER — HYDROCODONE-ACETAMINOPHEN 5-325 MG PO TABS: 1.0000 | ORAL_TABLET | Freq: Four times a day (QID) | ORAL | 0 refills | Status: DC | PRN

## 2016-11-01 NOTE — Telephone Encounter (Signed)
Patient requests a refill on Hydrocodone/Acetaminophen (Norco)  5-325  Mgs.   Qty  30   Sig: Take 1 tablet by mouth every 6 (six) hours as needed for moderate pain (Must last 14 days.Do not take and drive a car or use machinery.).

## 2016-11-18 ENCOUNTER — Telehealth: Payer: Self-pay | Admitting: Orthopaedic Surgery

## 2016-11-22 MED ORDER — HYDROCODONE-ACETAMINOPHEN 5-325 MG PO TABS: 1.0000 | ORAL_TABLET | Freq: Four times a day (QID) | ORAL | 0 refills | Status: AC | PRN

## 2016-11-30 ENCOUNTER — Telehealth: Payer: Self-pay | Admitting: Orthopaedic Surgery

## 2016-11-30 NOTE — Telephone Encounter (Signed)
Hydrocodone-Acetaminophen 5/325 mg   Qty 20 Tablets 

## 2016-11-30 NOTE — Telephone Encounter (Signed)
Deny.  No more.

## 2016-12-02 ENCOUNTER — Ambulatory Visit: Payer: Medicaid Other | Admitting: Orthopaedic Surgery

## 2016-12-08 ENCOUNTER — Ambulatory Visit: Payer: Medicaid Other | Admitting: Orthopaedic Surgery

## 2017-04-22 ENCOUNTER — Encounter (HOSPITAL_COMMUNITY): Payer: Self-pay | Admitting: Emergency Medicine

## 2017-04-22 ENCOUNTER — Emergency Department (HOSPITAL_COMMUNITY)
Admission: EM | Admit: 2017-04-22 | Discharge: 2017-04-22 | Disposition: A | Discharge status: Home / Self Care | Payer: Medicaid Other | Attending: Emergency Medicine | Admitting: Emergency Medicine

## 2017-04-22 ENCOUNTER — Emergency Department (HOSPITAL_COMMUNITY): Payer: Medicaid Other

## 2017-04-22 DIAGNOSIS — F17210 Nicotine dependence, cigarettes, uncomplicated: Secondary | ICD-10-CM | POA: Insufficient documentation

## 2017-04-22 DIAGNOSIS — R509 Fever, unspecified: Secondary | ICD-10-CM | POA: Diagnosis present

## 2017-04-22 DIAGNOSIS — J069 Acute upper respiratory infection, unspecified: Secondary | ICD-10-CM | POA: Insufficient documentation

## 2017-04-22 DIAGNOSIS — J020 Streptococcal pharyngitis: Secondary | ICD-10-CM

## 2017-04-22 LAB — RAPID STREP SCREEN (MED CTR MEBANE ONLY): Streptococcus, Group A Screen (Direct): POSITIVE — AB

## 2017-04-22 MED ORDER — ALBUTEROL SULFATE (2.5 MG/3ML) 0.083% IN NEBU
2.5000 mg | INHALATION_SOLUTION | Freq: Once | RESPIRATORY_TRACT | Status: AC
  Administered 2017-04-22: 2.5 mg via RESPIRATORY_TRACT
  Filled 2017-04-22: qty 3

## 2017-04-22 MED ORDER — AMOXICILLIN 500 MG PO CAPS: 500.0000 mg | ORAL_CAPSULE | Freq: Three times a day (TID) | ORAL | 0 refills | Status: AC

## 2017-04-22 MED ORDER — ALBUTEROL SULFATE HFA 108 (90 BASE) MCG/ACT IN AERS: 2.0000 | INHALATION_SPRAY | RESPIRATORY_TRACT | 0 refills | Status: AC | PRN

## 2017-04-22 NOTE — ED Triage Notes (Signed)
Patient complaining of cough and fever x 1 week. Denies any tylenol or ibuprofen use today.

## 2017-04-22 NOTE — ED Provider Notes (Signed)
[facility]-EMERGENCY DEPT Provider Note   CSN: [id number] Arrival date & time: 04/22/17  0956     History   Chief Complaint Chief Complaint  Patient presents with  . Cough  . Fever    HPI [name] is a 31 y.o. female who presents with 1 week of productive cough and intermittent fever. Patient states that cough has been productive with yellow/green sputum. She has been taking Tylenol for fever relief and reports improvement in fever with use. She has not taken any Tylenol or ibuprofen today. She reports that yesterday she started feeling worse and started having some shortness of breath secondary to the cough which concerned her. She has tried DayQuil for symptom relief with no improvement. She reports associated sore throat, congestion, and chest "ache" when she coughs. No chest pain at rest. She had one episode of vomiting this week and has otherwise been able to tolerate by mouth. She denies any sick contacts. She reports that she smokes about three fourths of a pack cigarettes a day and has done this for the last 20 years. He denies any other illicit drug use.    The history is provided by the patient.    History reviewed. No pertinent past medical history.  There are no active problems to display for this patient.   Past Surgical History:  Procedure Laterality Date  . CHOLECYSTECTOMY    . TUBAL LIGATION      OB History    No data available       Home Medications    Prior to Admission medications   Medication Sig Start Date End Date Taking? Authorizing Provider  acetaminophen (TYLENOL) 325 MG tablet Take 650 mg by mouth every 6 (six) hours as needed.    [provider]  albuterol (PROVENTIL HFA;VENTOLIN HFA) 108 (90 Base) MCG/ACT inhaler Inhale 2 puffs into the lungs every 4 (four) hours as needed for wheezing or shortness of breath. 04/22/17   Maxwell Caul, PA-C  amoxicillin (AMOXIL) 500 MG capsule Take 1 capsule (500 mg total) by mouth 3 (three) times  daily. 04/22/17   Maxwell Caul, PA-C  HYDROcodone-acetaminophen (NORCO/VICODIN) 5-325 MG tablet Take 1 tablet by mouth every 6 (six) hours as needed for moderate pain (Must last 14 days.Do not take and drive a car or use machinery.). 11/22/16   Darreld Mclean, MD    Family History History reviewed. No pertinent family history.  Social History Social History  Substance Use Topics  . Smoking status: Current Every Day Smoker    Packs/day: 0.75    Years: 13.00    Types: Cigarettes  . Smokeless tobacco: Never Used  . Alcohol use No     Allergies   Patient has no known allergies.   Review of Systems Review of Systems  Constitutional: Negative for fever.  HENT: Positive for congestion and sore throat.   Respiratory: Positive for cough and shortness of breath.   Cardiovascular: Negative for chest pain.  Gastrointestinal: Positive for vomiting. Negative for abdominal pain, constipation, diarrhea and nausea.  Genitourinary: Negative for dysuria and hematuria.  Neurological: Negative for headaches.  All other systems reviewed and are negative.    Physical Exam Updated Vital Signs BP 105/69 (BP Location: Right Arm)   Pulse 80   Temp 98 F (36.7 C) (Oral)   Resp 17   Ht 5\' 1"  (1.549 m)   Wt 54.4 kg   LMP 04/12/2017   SpO2 97%   BMI 22.67 kg/m   Physical  Exam  Constitutional: She appears well-developed and well-nourished.  Sitting comfortably on examination table.  HENT:  Head: Normocephalic and atraumatic.  Mouth/Throat: Uvula is midline. Mucous membranes are dry. Oropharyngeal exudate, posterior oropharyngeal edema and posterior oropharyngeal erythema present.  Posterior oropharynx with evidence of exudate, erythema, edema. Uvula is midline. No signs of abscess.   Eyes: Conjunctivae, EOM and lids are normal. Pupils are equal, round, and reactive to light. Right eye exhibits no discharge. Left eye exhibits no discharge. No scleral icterus.  Cardiovascular: Regular  rhythm and intact distal pulses.  Tachycardia present.   Pulmonary/Chest: Effort normal and breath sounds normal. No respiratory distress. She has no decreased breath sounds. She has no wheezes. She has no rales.  No signs of respiratory distress. Able to speak in full sentences without any difficulty. Intermittent coughing. Lung sounds clear to auscultation. No evidence of wheezing, rales or rhonchi.  Musculoskeletal: She exhibits no deformity.  Neurological: She is alert.  Skin: Skin is warm and dry.  Psychiatric: She has a normal mood and affect. Her speech is normal and behavior is normal.     ED Treatments / Results  Labs (all labs ordered are listed, but only abnormal results are displayed) Labs Reviewed  RAPID STREP SCREEN (NOT AT [facility]) - Abnormal; Notable for the following:       Result Value   Streptococcus, Group A Screen (Direct) POSITIVE (*)    All other components within normal limits    EKG  EKG Interpretation None       Radiology Dg Chest 2 View  Result Date: 04/22/2017 CLINICAL DATA:  Cough and fever EXAM: CHEST  2 VIEW COMPARISON:  None. FINDINGS: The heart size and mediastinal contours are within normal limits. Both lungs are clear. The visualized skeletal structures are unremarkable. IMPRESSION: No active cardiopulmonary disease. Electronically Signed   By: Kennith Center M.D.   On: 04/22/2017 10:45    Procedures Procedures (including critical care time)  Medications Ordered in ED Medications  albuterol (PROVENTIL) (2.5 MG/3ML) 0.083% nebulizer solution 2.5 mg (2.5 mg Nebulization Given 04/22/17 1139)     Initial Impression / Assessment and Plan / ED Course  I have reviewed the triage vital signs and the nursing notes.  Pertinent labs & imaging results that were available during my care of the patient were reviewed by me and considered in my medical decision making (see chart for details).   31 year old female with 1 week of coughing and intermittent  fever. No improvement with over-the-counter medications. Patient does have a smoking history. Physical exam shows no signs of respiratory distress. Lung sounds clear to auscultation. No wheezing, rales, rhonchi. Posterior oropharynx with some erythema and exudates. No cervical lymphadenopathy. Consider upper respiratory infection versus bronchitis versus pneumonia, the low suspicion of pneumonia given history/physical exam. Also consider strep given posterior oropharynx findings. Chest x-ray ordered in triage. Will get rapid strep to evaluate for strep pharyngitis. Albuterol nebulizer ordered.  Chest x-ray reviewed. Negative for any acute infectious process. Rapid strep is positive.  Discussed results with patient. She reports improvement of respiratory symptoms after an albuterol nebulizer.  Likely strep pharyngitis with co- existing upper respiratory infection. Will plan to treat with amoxicillin. Will also provide albuterol inhaler to help with respiratory symptoms. Checked patient to follow-up with her primary care doctor in the next 24-48 hours. Strict return precautions discussed. Patient was understanding and agreement to plan.  Final Clinical Impressions(s) / ED Diagnoses   Final diagnoses:  Strep pharyngitis  Upper  respiratory tract infection, unspecified type    New Prescriptions Discharge Medication List as of 04/22/2017 12:27 PM    START taking these medications   Details  albuterol (PROVENTIL HFA;VENTOLIN HFA) 108 (90 Base) MCG/ACT inhaler Inhale 2 puffs into the lungs every 4 (four) hours as needed for wheezing or shortness of breath., Starting Fri 04/22/2017, Print    amoxicillin (AMOXIL) 500 MG capsule Take 1 capsule (500 mg total) by mouth 3 (three) times daily., Starting Fri 04/22/2017, Print         Maxwell CaulLayden, Carlen Rebuck A, PA-C 04/22/17 1620    Lavera GuiseLiu, Dana Duo, MD 04/23/17 [phone]

## 2017-04-22 NOTE — Discharge Instructions (Addendum)
Take medications as prescribed. Finish her course of antibiotics.  Use the albuterol inhaler as directed to help with breathing difficulties.  Use over-the-counter cough syrups such as Delsym to help with the cough relief  Follow-up with her doctor in the next 24-48 hours.  Make sure you're getting a lot of fluids to stay adequately hydrated.  Return to the emergency department for any worsening cough, fever, worsening sore throat, difficulty breathing, chest pain or any other worsening or concerning symptoms.

## 2017-05-23 ENCOUNTER — Encounter: Payer: Self-pay | Admitting: Orthopaedic Surgery

## 2018-09-13 IMAGING — CR DG CHEST 2V
2 series · 2 of 2 positions shown · non-contrast
Comparison: None.

CLINICAL DATA: Cough and fever

EXAM:
CHEST  2 VIEW

[w chest pa]
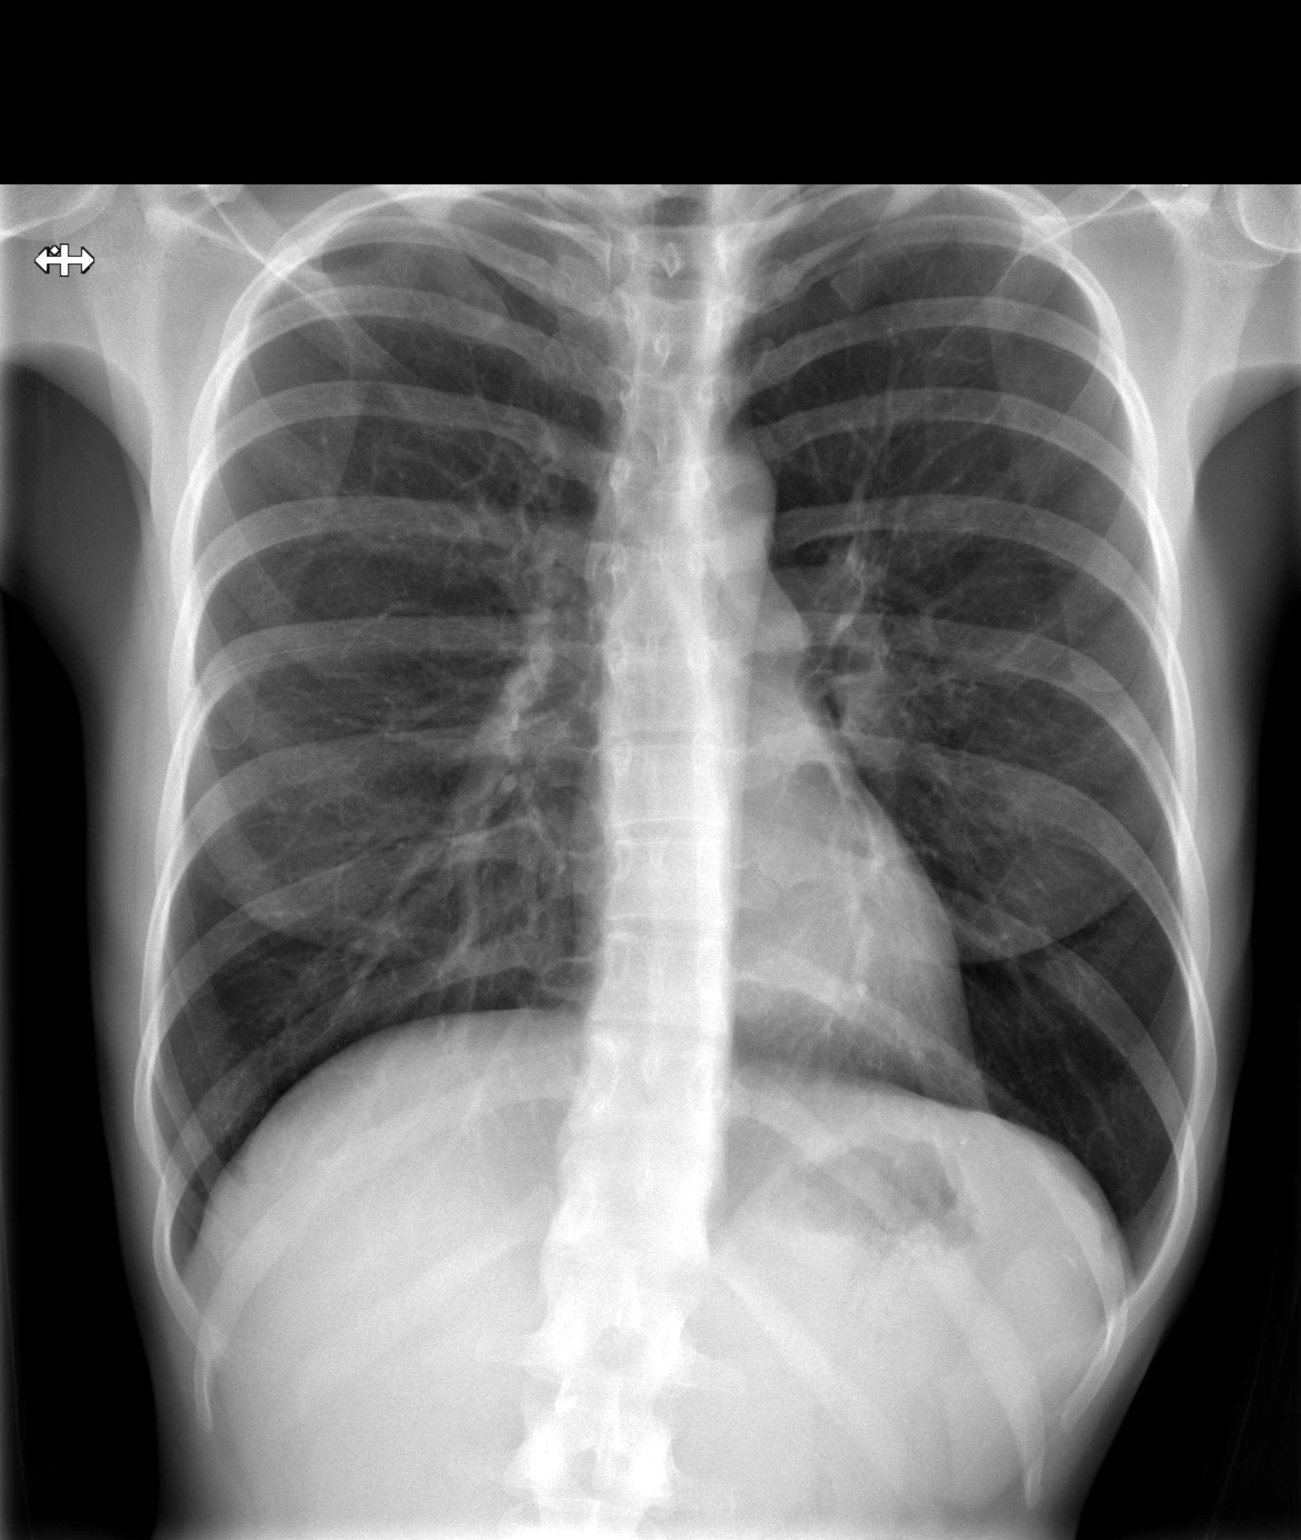

[w chest lat]
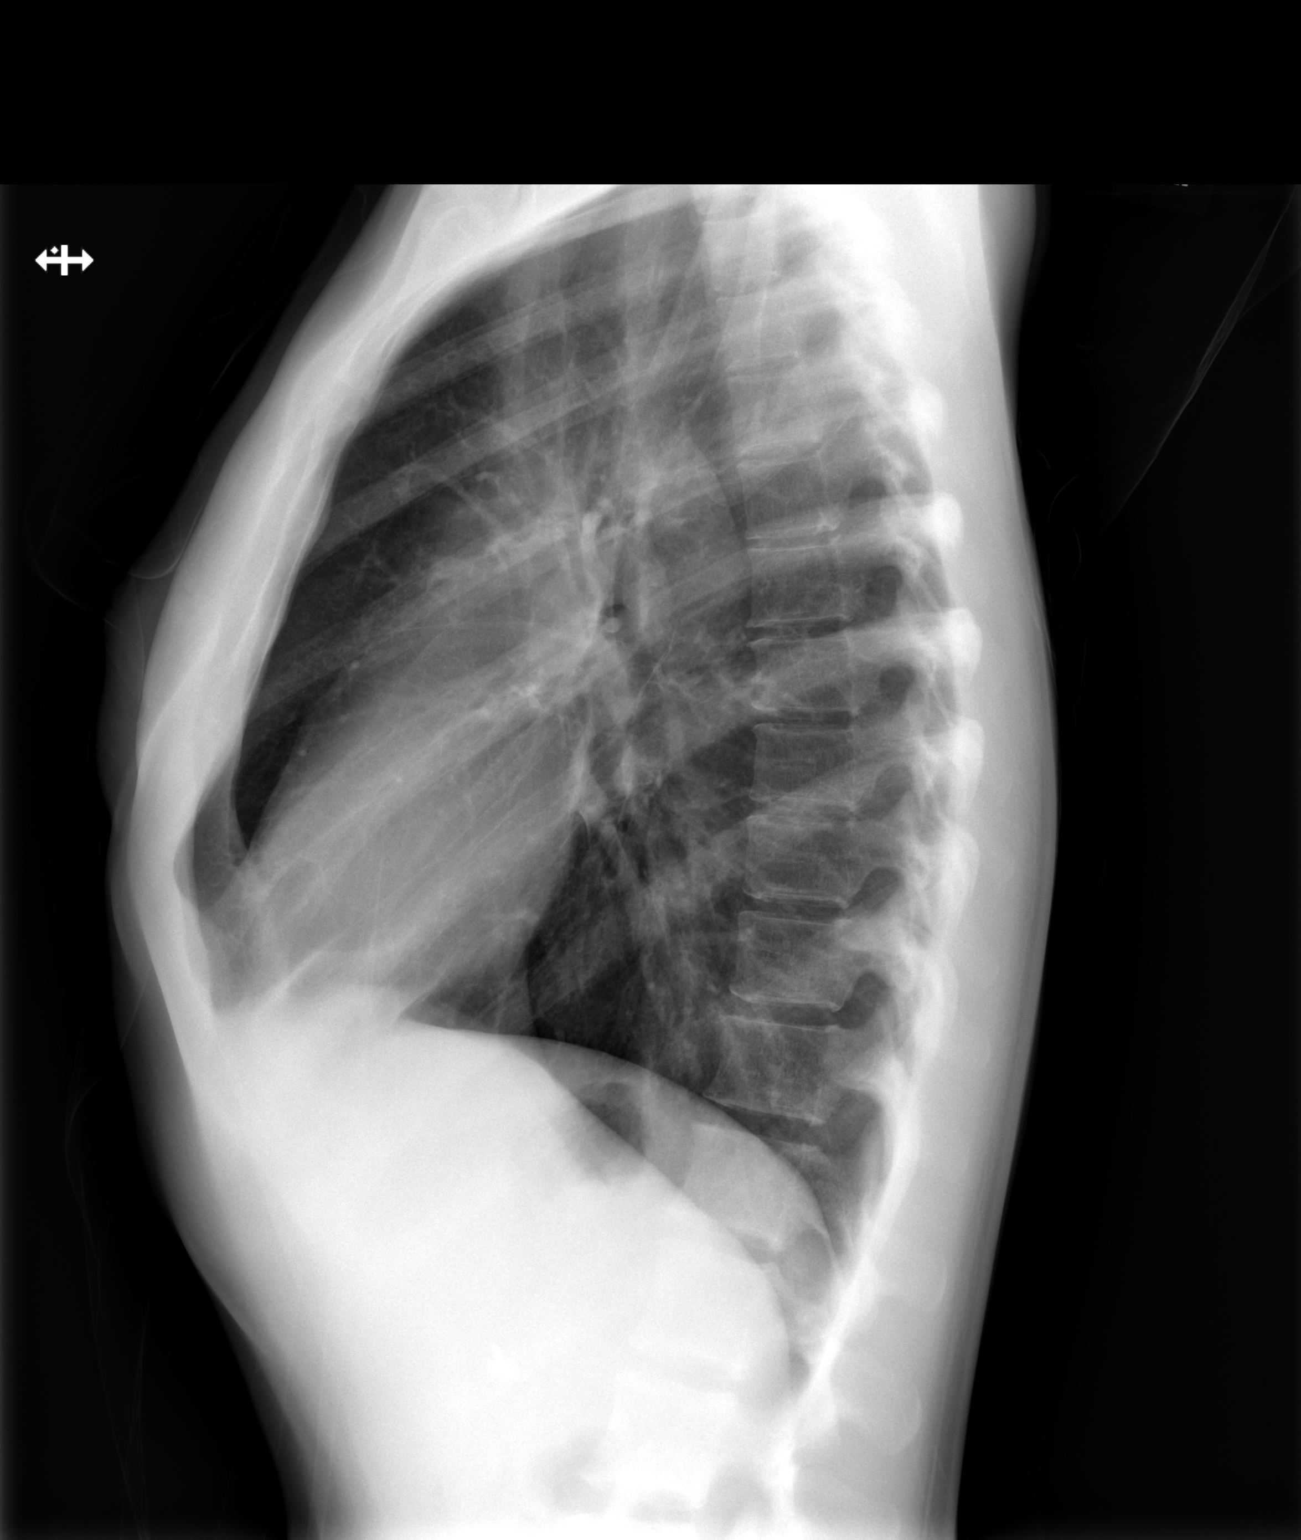

[2 of 2 positions shown; findings below may reference images not displayed]

FINDINGS: The heart size and mediastinal contours are within normal limits.
Both lungs are clear. The visualized skeletal structures are
unremarkable.
IMPRESSION: No active cardiopulmonary disease.
